# Patient Record
Sex: Male | Born: 2000 | Race: White | Hispanic: No | Marital: Single | Smoking: Never smoker
Health system: Southern US, Community
[De-identification: ages and names within clinical notes are randomized; demographics above are authoritative.]

## PROBLEM LIST (undated history)

## (undated) DIAGNOSIS — S82409A Unspecified fracture of shaft of unspecified fibula, initial encounter for closed fracture: Secondary | ICD-10-CM

## (undated) DIAGNOSIS — S82209A Unspecified fracture of shaft of unspecified tibia, initial encounter for closed fracture: Secondary | ICD-10-CM

## (undated) DIAGNOSIS — S40212A Abrasion of left shoulder, initial encounter: Secondary | ICD-10-CM

---
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
## 2022-12-21 DIAGNOSIS — E876 Hypokalemia: Secondary | ICD-10-CM | POA: Diagnosis not present

## 2022-12-21 DIAGNOSIS — G8911 Acute pain due to trauma: Secondary | ICD-10-CM | POA: Diagnosis not present

## 2022-12-21 DIAGNOSIS — R739 Hyperglycemia, unspecified: Secondary | ICD-10-CM | POA: Diagnosis not present
